# Patient Record
Sex: Male | Born: 1975 | Race: White | Hispanic: No | Marital: Married | Smoking: Current every day smoker
Health system: Southern US, Community
[De-identification: ages and names within clinical notes are randomized; demographics above are authoritative.]

---

## 1998-06-11 ENCOUNTER — Observation Stay (HOSPITAL_COMMUNITY): Admission: EM | Admit: 1998-06-11 | Discharge: 1998-06-12 | Payer: Self-pay | Admitting: Emergency Medicine

## 2011-06-07 NOTE — Progress Notes (Signed)
Encounter addended by: Mady Gemma, PHARMD on: 06/07/2011  3:21 PM<BR>     Documentation filed: Orders

## 2014-07-25 ENCOUNTER — Emergency Department (HOSPITAL_COMMUNITY): Payer: 59

## 2014-07-25 ENCOUNTER — Encounter (HOSPITAL_COMMUNITY): Payer: Self-pay

## 2014-07-25 ENCOUNTER — Emergency Department (HOSPITAL_COMMUNITY)
Admission: EM | Admit: 2014-07-25 | Discharge: 2014-07-25 | Disposition: A | Discharge status: Home / Self Care | Payer: 59 | Attending: Emergency Medicine | Admitting: Emergency Medicine

## 2014-07-25 DIAGNOSIS — Z720 Tobacco use: Secondary | ICD-10-CM | POA: Diagnosis not present

## 2014-07-25 DIAGNOSIS — R109 Unspecified abdominal pain: Secondary | ICD-10-CM | POA: Insufficient documentation

## 2014-07-25 LAB — COMPREHENSIVE METABOLIC PANEL
ALT: 39 U/L (ref 0–53)
AST: 24 U/L (ref 0–37)
Albumin: 5.1 g/dL (ref 3.5–5.2)
Alkaline Phosphatase: 72 U/L (ref 39–117)
Anion gap: 6 (ref 5–15)
BUN: 11 mg/dL (ref 6–23)
CO2: 24 mmol/L (ref 19–32)
Calcium: 9.5 mg/dL (ref 8.4–10.5)
Chloride: 107 mmol/L (ref 96–112)
Creatinine, Ser: 0.98 mg/dL (ref 0.50–1.35)
GFR calc Af Amer: >90 mL/min (ref >90)
GFR calc non Af Amer: >90 mL/min (ref >90)
Glucose, Bld: 102 mg/dL — ABNORMAL HIGH (ref 70–99)
Potassium: 3.6 mmol/L (ref 3.5–5.1)
Sodium: 137 mmol/L (ref 135–145)
Total Bilirubin: 0.9 mg/dL (ref 0.3–1.2)
Total Protein: 9 g/dL — ABNORMAL HIGH (ref 6.0–8.3)

## 2014-07-25 LAB — URINALYSIS, ROUTINE W REFLEX MICROSCOPIC
Bilirubin Urine: NEGATIVE
Glucose, UA: NEGATIVE mg/dL
Hgb urine dipstick: NEGATIVE
Ketones, ur: NEGATIVE mg/dL
Leukocytes, UA: NEGATIVE
Nitrite: NEGATIVE
Protein, ur: NEGATIVE mg/dL
Specific Gravity, Urine: 1.02 (ref 1.005–1.030)
Urobilinogen, UA: 0.2 mg/dL (ref 0.0–1.0)
pH: 6 (ref 5.0–8.0)

## 2014-07-25 LAB — CBC WITH DIFFERENTIAL/PLATELET
Basophils Absolute: 0 10*3/uL (ref 0.0–0.1)
Basophils Relative: 0 % (ref 0–1)
Eosinophils Absolute: 0 10*3/uL (ref 0.0–0.7)
Eosinophils Relative: 0 % (ref 0–5)
HCT: 48.1 % (ref 39.0–52.0)
Hemoglobin: 16.2 g/dL (ref 13.0–17.0)
Lymphocytes Relative: 19 % (ref 12–46)
Lymphs Abs: 2.4 10*3/uL (ref 0.7–4.0)
MCH: 28.7 pg (ref 26.0–34.0)
MCHC: 33.7 g/dL (ref 30.0–36.0)
MCV: 85.1 fL (ref 78.0–100.0)
Monocytes Absolute: 1.1 10*3/uL — ABNORMAL HIGH (ref 0.1–1.0)
Monocytes Relative: 9 % (ref 3–12)
Neutro Abs: 8.9 10*3/uL — ABNORMAL HIGH (ref 1.7–7.7)
Neutrophils Relative %: 72 % (ref 43–77)
Platelets: 411 10*3/uL — ABNORMAL HIGH (ref 150–400)
RBC: 5.65 MIL/uL (ref 4.22–5.81)
RDW: 13.7 % (ref 11.5–15.5)
WBC: 12.4 10*3/uL — ABNORMAL HIGH (ref 4.0–10.5)

## 2014-07-25 MED ORDER — OXYCODONE-ACETAMINOPHEN 5-325 MG PO TABS: 1.0000 | ORAL_TABLET | ORAL | Status: AC | PRN

## 2014-07-25 MED ORDER — IOHEXOL 300 MG/ML  SOLN
25.0000 mL | Freq: Once | INTRAMUSCULAR | Status: AC | PRN
  Administered 2014-07-25: 25 mL via ORAL

## 2014-07-25 MED ORDER — IOHEXOL 300 MG/ML  SOLN
100.0000 mL | Freq: Once | INTRAMUSCULAR | Status: AC | PRN
  Administered 2014-07-25: 100 mL via INTRAVENOUS

## 2014-07-25 MED ORDER — ONDANSETRON HCL 4 MG/2ML IJ SOLN
4.0000 mg | Freq: Once | INTRAMUSCULAR | Status: AC
  Administered 2014-07-25: 4 mg via INTRAMUSCULAR
  Filled 2014-07-25: qty 2

## 2014-07-25 MED ORDER — HYDROMORPHONE HCL 1 MG/ML IJ SOLN
1.0000 mg | Freq: Once | INTRAMUSCULAR | Status: AC
  Administered 2014-07-25: 1 mg via INTRAVENOUS
  Filled 2014-07-25: qty 1

## 2014-07-25 NOTE — ED Notes (Signed)
MD at bedside. 

## 2014-07-25 NOTE — Discharge Instructions (Signed)

## 2014-07-25 NOTE — ED Provider Notes (Signed)
CSN: [id number]     Arrival date & time 07/25/14  1231 History   First MD Initiated Contact with Patient 07/25/14 1305     Chief Complaint  Patient presents with  . Flank Pain     (Consider location/radiation/quality/duration/timing/severity/associated sxs/prior Treatment) HPI   39 year old male with abdominal pain. Right sided. Gradual onset about 3 or 4 days ago. Radiation to the right flank. Initially waxing and waning, but now constant over the past 24 hours. No appreciable exacerbating relieving factors. Nausea, no vomiting. No urinary complaints. Several loose stools. No fevers or chills. No sick contacts.  History reviewed. No pertinent past medical history. History reviewed. No pertinent past surgical history. No family history on file. History  Substance Use Topics  . Smoking status: Current Every Day Smoker  . Smokeless tobacco: Not on file  . Alcohol Use: No    Review of Systems  All systems reviewed and negative, other than as noted in HPI.   Allergies  Review of patient's allergies indicates no known allergies.  Home Medications   Prior to Admission medications   Not on File   BP 134/97 mmHg  Pulse 90  Temp(Src) 98.4 F (36.9 C) (Oral)  Resp 18  Ht  (1.702 m)  Wt 205 lb (92.987 kg)  BMI 32.10 kg/m2  SpO2 96% Physical Exam  Constitutional: He appears well-developed and well-nourished. No distress.  HENT:  Head: Normocephalic and atraumatic.  Eyes: Conjunctivae are normal. Right eye exhibits no discharge. Left eye exhibits no discharge.  Neck: Neck supple.  Cardiovascular: Normal rate, regular rhythm and normal heart sounds.  Exam reveals no gallop and no friction rub.   No murmur heard. Pulmonary/Chest: Effort normal and breath sounds normal. No respiratory distress.  Abdominal: Soft. He exhibits no distension. There is tenderness. There is no rebound and no guarding.  Musculoskeletal: He exhibits no edema or tenderness.  Neurological: He is  alert.  Skin: Skin is warm and dry.  Psychiatric: He has a normal mood and affect. His behavior is normal. Thought content normal.  Nursing note and vitals reviewed.   ED Course  Procedures (including critical care time) Labs Review Labs Reviewed  CBC WITH DIFFERENTIAL/PLATELET - Abnormal; Notable for the following:    WBC 12.4 (*)    Platelets 411 (*)    Neutro Abs 8.9 (*)    Monocytes Absolute 1.1 (*)    All other components within normal limits  COMPREHENSIVE METABOLIC PANEL - Abnormal; Notable for the following:    Glucose, Bld 102 (*)    Total Protein 9.0 (*)    All other components within normal limits  URINALYSIS, ROUTINE W REFLEX MICROSCOPIC    Imaging Review Dg Chest 2 View  07/25/2014   CLINICAL DATA:  Three day history of intermittent chest pain  EXAM: CHEST  2 VIEW  COMPARISON:  None.  FINDINGS: The heart size and mediastinal contours are within normal limits. Both lungs are clear. The visualized skeletal structures are unremarkable.  IMPRESSION: Normal chest x-ray.   Electronically Signed   By: Rudie Meyer M.D.   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 Numerous symptoms that do not necessarily correlate well. Says R lower chest/flank/RUQ and sometimes post-prandial. Consider biliary colic. Only tenderness I can illicit is in RLQ though and mild at that. Dysuria but UA normal. No CVA tenderness. I do not get the impression this is renal colic. I think less likely PUD/GERD.  No respiratory complaints. CXR clear. No increased WOB. Not pleuritic. Doubt PE. Doubt ACS. Will CT to with some RLQ tenderness although my overall suspicion for acute surgical process or other serious pathology is generally low. Symptomatic tx. Discussed with Dr Estell HarpinZammit, with CT pending. I feel pt is appropriate for discharge assuming no significant CT findings.    Raeford RazorStephen Nyeshia Mysliwiec, MD 07/29/14 1900

## 2014-07-25 NOTE — ED Notes (Signed)
Pt c/o pain under r breast x 3 or 4 days.  Reports r flank pain started around the same time.  Reports burning with urination, no obvious blood in urine.  Denies n/v but reports diarrhea.  Denies any SOB.  Denies history of kidney stones.

## 2015-11-29 ENCOUNTER — Other Ambulatory Visit (HOSPITAL_COMMUNITY): Payer: Self-pay | Admitting: Internal Medicine

## 2015-11-29 DIAGNOSIS — R319 Hematuria, unspecified: Secondary | ICD-10-CM

## 2015-12-09 ENCOUNTER — Ambulatory Visit (HOSPITAL_COMMUNITY)
Admission: RE | Admit: 2015-12-09 | Discharge: 2015-12-09 | Disposition: A | Discharge status: Home / Self Care | Payer: 59 | Source: Ambulatory Visit | Attending: Internal Medicine | Admitting: Internal Medicine

## 2015-12-09 DIAGNOSIS — R319 Hematuria, unspecified: Secondary | ICD-10-CM | POA: Diagnosis not present

## 2016-08-03 DIAGNOSIS — G894 Chronic pain syndrome: Secondary | ICD-10-CM | POA: Diagnosis not present

## 2016-08-31 DIAGNOSIS — G894 Chronic pain syndrome: Secondary | ICD-10-CM | POA: Diagnosis not present

## 2016-08-31 DIAGNOSIS — M47816 Spondylosis without myelopathy or radiculopathy, lumbar region: Secondary | ICD-10-CM | POA: Diagnosis not present

## 2016-11-23 DIAGNOSIS — G894 Chronic pain syndrome: Secondary | ICD-10-CM | POA: Diagnosis not present

## 2017-02-15 DIAGNOSIS — G894 Chronic pain syndrome: Secondary | ICD-10-CM | POA: Diagnosis not present

## 2017-05-10 DIAGNOSIS — G894 Chronic pain syndrome: Secondary | ICD-10-CM | POA: Diagnosis not present

## 2017-08-02 DIAGNOSIS — G894 Chronic pain syndrome: Secondary | ICD-10-CM | POA: Diagnosis not present

## 2017-08-02 DIAGNOSIS — J309 Allergic rhinitis, unspecified: Secondary | ICD-10-CM | POA: Diagnosis not present

## 2017-09-04 IMAGING — CT CT RENAL STONE PROTOCOL
2 of 4 series · 17 of 46 positions shown, 19 images · non-contrast
Comparison: None.

CLINICAL DATA: Right-sided pain for 3-4 months with hematuria

EXAM:
CT ABDOMEN AND PELVIS WITHOUT CONTRAST
TECHNIQUE: Multidetector CT imaging of the abdomen and pelvis was performed
following the standard protocol without IV contrast.

[Series 2: routine abd pel with · axial · 0.84mm/px · z∈[-446,-6]mm · 14 of 98 slices shown, 16 images]
[im 5/98  soft-tissue]
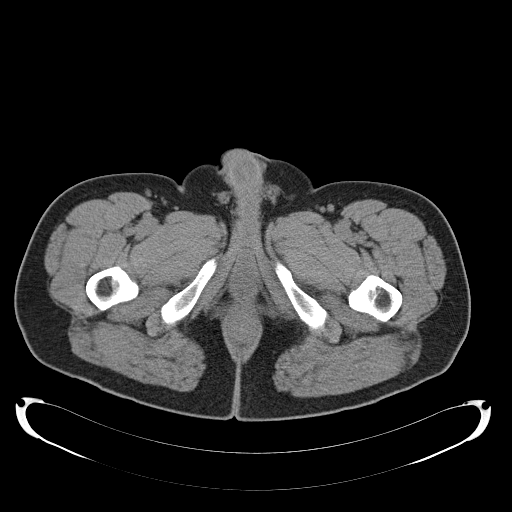
[im 5/98  bone]
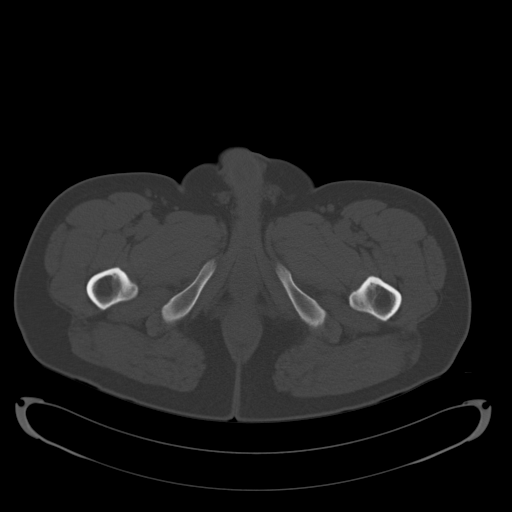
[im 13/98  soft-tissue]
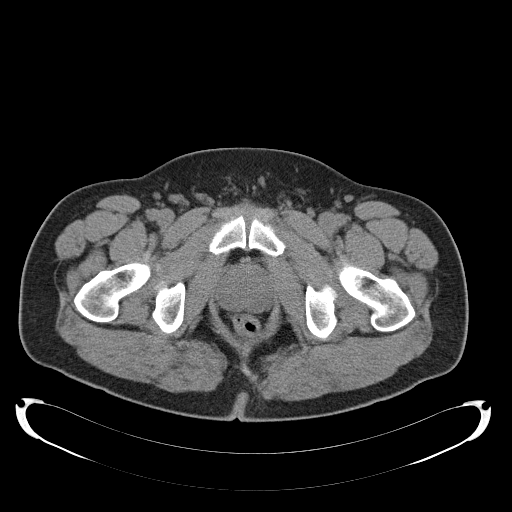
[im 17/98  soft-tissue]
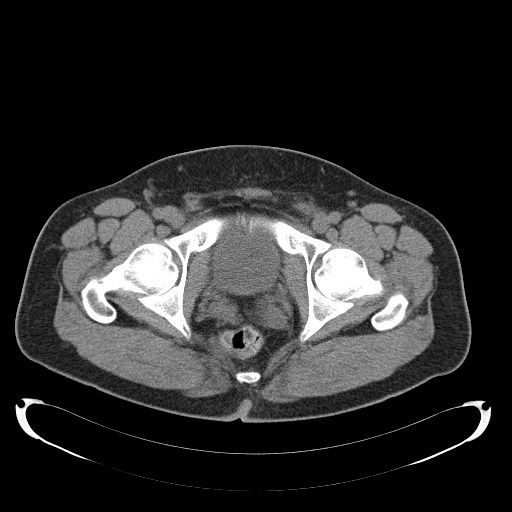
[im 26/98  soft-tissue]
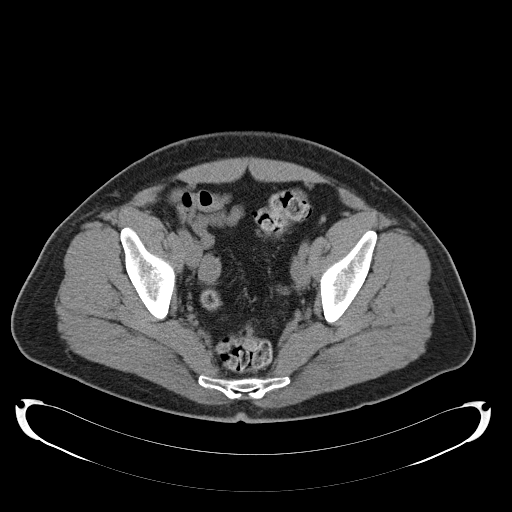
[im 34/98  soft-tissue]
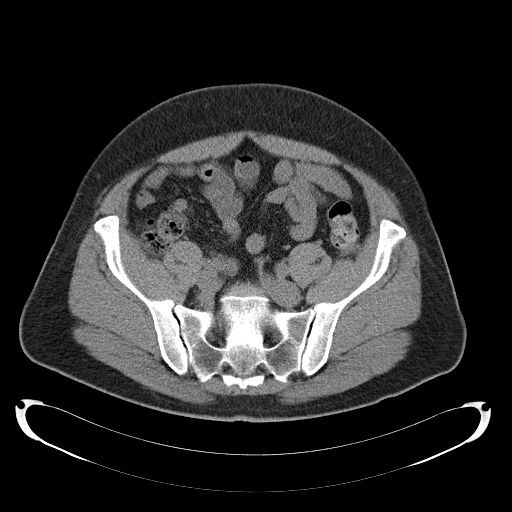
[im 38/98  soft-tissue]
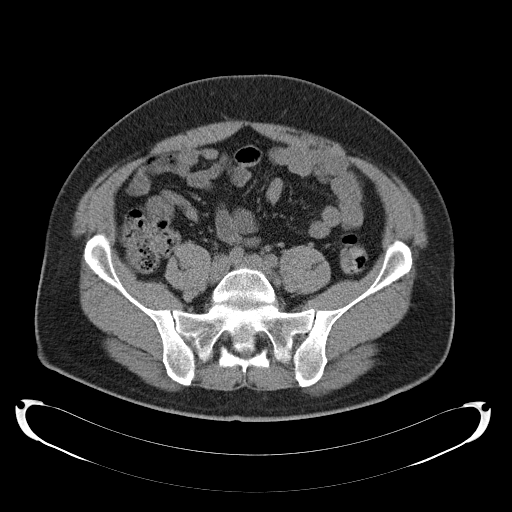
[im 47/98  soft-tissue]
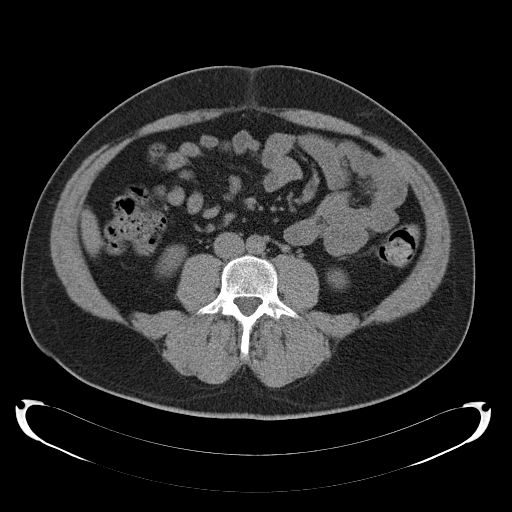
[im 51/98  soft-tissue]
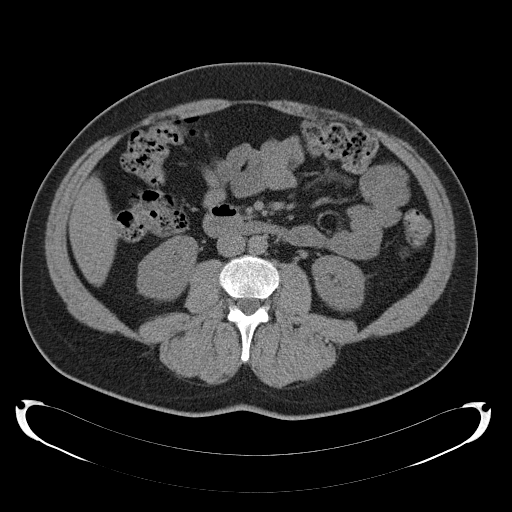
[im 60/98  soft-tissue]
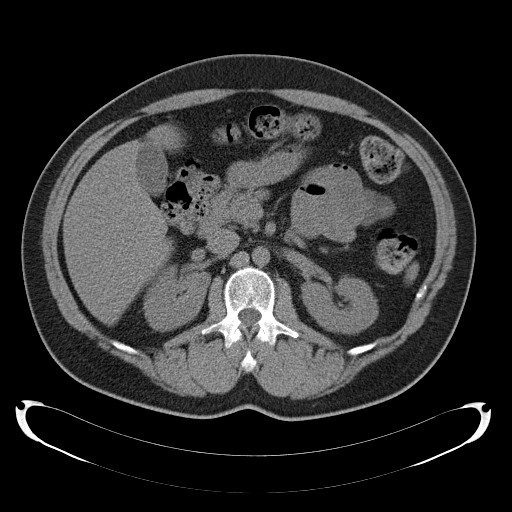
[im 60/98  bone]
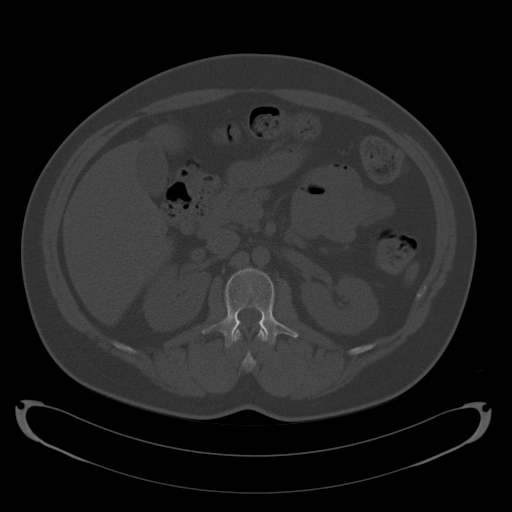
[im 64/98  soft-tissue]
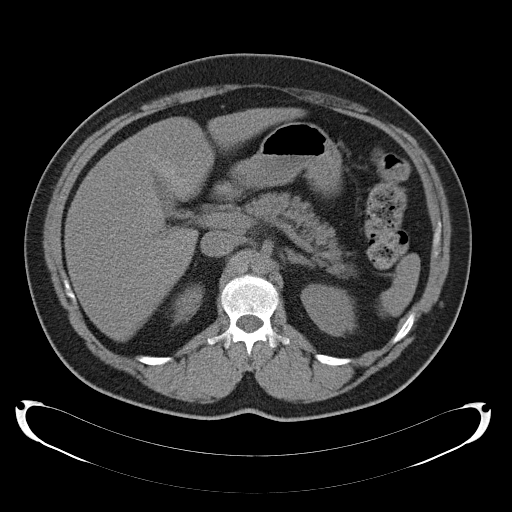
[im 72/98  soft-tissue]
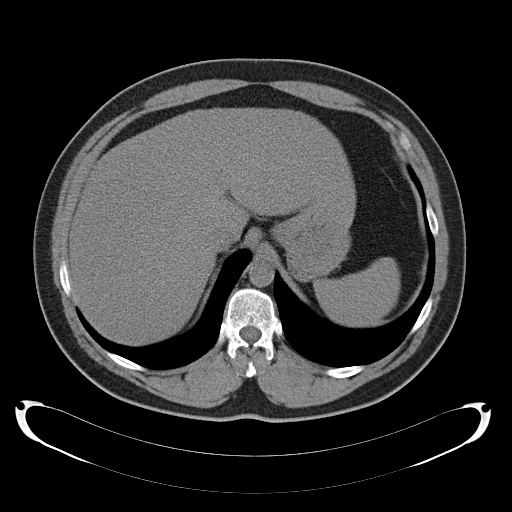
[im 81/98  soft-tissue]
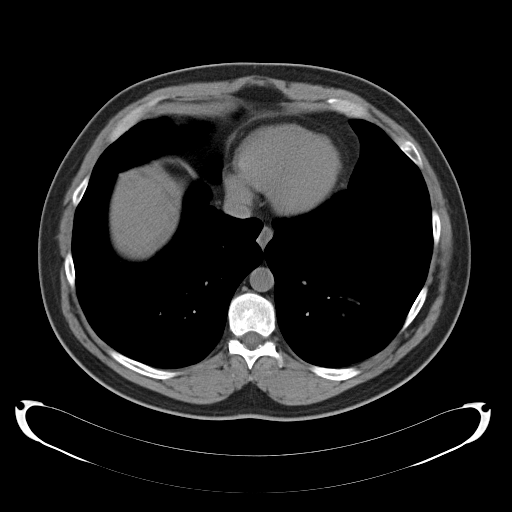
[im 85/98  soft-tissue]
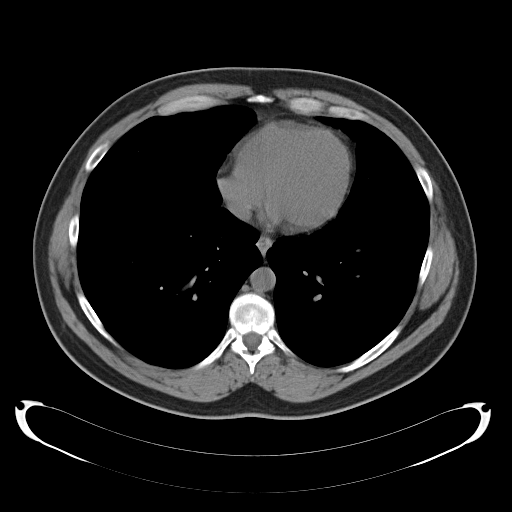
[im 93/98  soft-tissue]
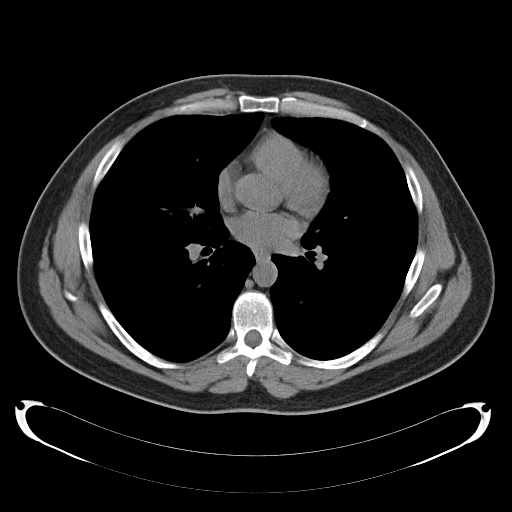

[Series 3: coronal · coronal · 0.87mm/px · 3 of 152 slices shown]
[im 51/152  soft-tissue]
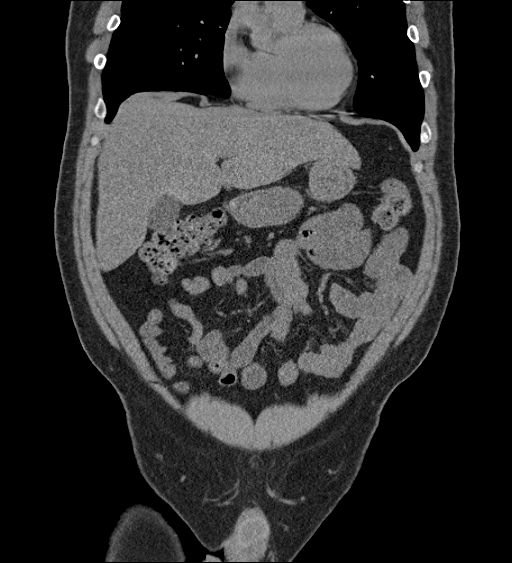
[im 68/152  soft-tissue]
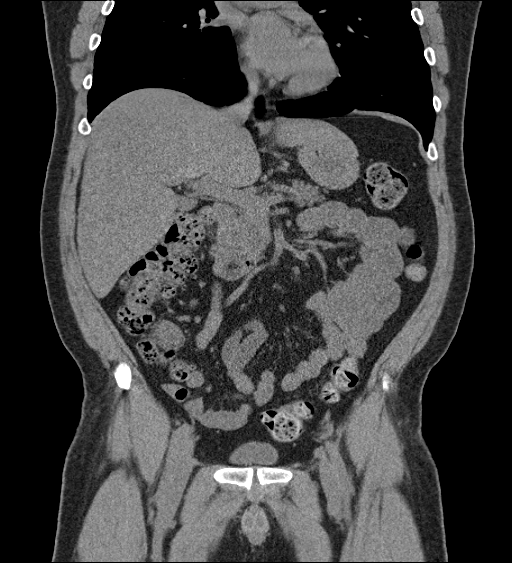
[im 84/152  soft-tissue]
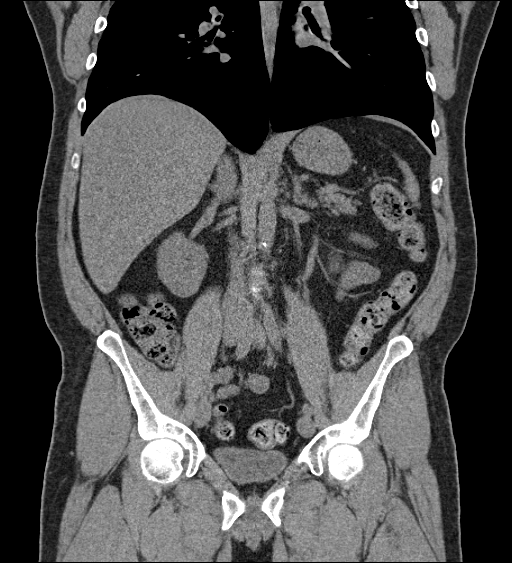

[17 of 46 positions shown; findings below may reference images not displayed]

FINDINGS: Lower chest:  No acute findings.

Hepatobiliary: No mass visualized on this un-enhanced exam.

Pancreas: No mass or inflammatory process identified on this
un-enhanced exam.

Spleen: Within normal limits in size.

Adrenals/Urinary Tract: Normal adrenal glands. No evidence of
urolithiasis or hydronephrosis. No definite mass visualized on this
un-enhanced exam. Normal bladder.

Stomach/Bowel: No evidence of obstruction, inflammatory process, or
abnormal fluid collections. No pneumatosis, pneumoperitoneum or
portal venous gas. Diverticulosis without evidence of
diverticulitis. Normal appendix.

Vascular/Lymphatic: No pathologically enlarged lymph nodes. No
evidence of abdominal aortic aneurysm.

Reproductive: No mass or other significant abnormality.

Other: None.

Musculoskeletal: No suspicious bone lesions identified. Bilateral L5
pars interarticularis defects with grade 1 anterolisthesis of L5 on
S1.
IMPRESSION: 1. No urolithiasis or obstructive uropathy.

## 2017-10-11 DIAGNOSIS — M1711 Unilateral primary osteoarthritis, right knee: Secondary | ICD-10-CM | POA: Diagnosis not present

## 2017-10-11 DIAGNOSIS — M25561 Pain in right knee: Secondary | ICD-10-CM | POA: Diagnosis not present

## 2017-10-11 DIAGNOSIS — M25469 Effusion, unspecified knee: Secondary | ICD-10-CM | POA: Diagnosis not present

## 2017-10-24 DIAGNOSIS — M25461 Effusion, right knee: Secondary | ICD-10-CM | POA: Diagnosis not present

## 2017-10-24 DIAGNOSIS — G894 Chronic pain syndrome: Secondary | ICD-10-CM | POA: Diagnosis not present

## 2017-10-24 DIAGNOSIS — M1991 Primary osteoarthritis, unspecified site: Secondary | ICD-10-CM | POA: Diagnosis not present

## 2017-11-15 DIAGNOSIS — M238X1 Other internal derangements of right knee: Secondary | ICD-10-CM | POA: Diagnosis not present

## 2017-11-15 DIAGNOSIS — M25561 Pain in right knee: Secondary | ICD-10-CM | POA: Diagnosis not present

## 2017-11-22 DIAGNOSIS — M25561 Pain in right knee: Secondary | ICD-10-CM | POA: Diagnosis not present

## 2017-12-06 DIAGNOSIS — M1712 Unilateral primary osteoarthritis, left knee: Secondary | ICD-10-CM | POA: Diagnosis not present

## 2017-12-06 DIAGNOSIS — M1711 Unilateral primary osteoarthritis, right knee: Secondary | ICD-10-CM | POA: Diagnosis not present

## 2018-01-17 DIAGNOSIS — M1991 Primary osteoarthritis, unspecified site: Secondary | ICD-10-CM | POA: Diagnosis not present

## 2018-01-17 DIAGNOSIS — G894 Chronic pain syndrome: Secondary | ICD-10-CM | POA: Diagnosis not present

## 2018-01-17 DIAGNOSIS — K047 Periapical abscess without sinus: Secondary | ICD-10-CM | POA: Diagnosis not present

## 2018-04-11 DIAGNOSIS — M2550 Pain in unspecified joint: Secondary | ICD-10-CM | POA: Diagnosis not present

## 2018-04-11 DIAGNOSIS — Z1389 Encounter for screening for other disorder: Secondary | ICD-10-CM | POA: Diagnosis not present

## 2018-04-11 DIAGNOSIS — M1991 Primary osteoarthritis, unspecified site: Secondary | ICD-10-CM | POA: Diagnosis not present

## 2018-04-11 DIAGNOSIS — Z0001 Encounter for general adult medical examination with abnormal findings: Secondary | ICD-10-CM | POA: Diagnosis not present

## 2018-04-11 DIAGNOSIS — G894 Chronic pain syndrome: Secondary | ICD-10-CM | POA: Diagnosis not present

## 2018-07-04 DIAGNOSIS — Z719 Counseling, unspecified: Secondary | ICD-10-CM | POA: Diagnosis not present

## 2018-07-04 DIAGNOSIS — M1991 Primary osteoarthritis, unspecified site: Secondary | ICD-10-CM | POA: Diagnosis not present

## 2018-07-04 DIAGNOSIS — G894 Chronic pain syndrome: Secondary | ICD-10-CM | POA: Diagnosis not present

## 2018-08-01 DIAGNOSIS — M1991 Primary osteoarthritis, unspecified site: Secondary | ICD-10-CM | POA: Diagnosis not present

## 2018-08-01 DIAGNOSIS — G894 Chronic pain syndrome: Secondary | ICD-10-CM | POA: Diagnosis not present

## 2018-08-01 DIAGNOSIS — I10 Essential (primary) hypertension: Secondary | ICD-10-CM | POA: Diagnosis not present

## 2018-08-19 DIAGNOSIS — E6609 Other obesity due to excess calories: Secondary | ICD-10-CM | POA: Diagnosis not present

## 2018-08-19 DIAGNOSIS — Z6835 Body mass index (BMI) 35.0-35.9, adult: Secondary | ICD-10-CM | POA: Diagnosis not present

## 2018-08-19 DIAGNOSIS — G894 Chronic pain syndrome: Secondary | ICD-10-CM | POA: Diagnosis not present

## 2018-10-22 DIAGNOSIS — G894 Chronic pain syndrome: Secondary | ICD-10-CM | POA: Diagnosis not present

## 2021-01-25 DIAGNOSIS — G894 Chronic pain syndrome: Secondary | ICD-10-CM | POA: Diagnosis not present

## 2021-01-25 DIAGNOSIS — M1991 Primary osteoarthritis, unspecified site: Secondary | ICD-10-CM | POA: Diagnosis not present

## 2021-01-25 DIAGNOSIS — R5383 Other fatigue: Secondary | ICD-10-CM | POA: Diagnosis not present

## 2021-02-23 DIAGNOSIS — I10 Essential (primary) hypertension: Secondary | ICD-10-CM | POA: Diagnosis not present

## 2021-02-23 DIAGNOSIS — M1991 Primary osteoarthritis, unspecified site: Secondary | ICD-10-CM | POA: Diagnosis not present

## 2021-02-23 DIAGNOSIS — G894 Chronic pain syndrome: Secondary | ICD-10-CM | POA: Diagnosis not present

## 2021-02-23 DIAGNOSIS — F419 Anxiety disorder, unspecified: Secondary | ICD-10-CM | POA: Diagnosis not present

## 2021-03-14 DIAGNOSIS — G4733 Obstructive sleep apnea (adult) (pediatric): Secondary | ICD-10-CM | POA: Diagnosis not present

## 2021-03-21 DIAGNOSIS — Z6835 Body mass index (BMI) 35.0-35.9, adult: Secondary | ICD-10-CM | POA: Diagnosis not present

## 2021-03-21 DIAGNOSIS — M1991 Primary osteoarthritis, unspecified site: Secondary | ICD-10-CM | POA: Diagnosis not present

## 2021-03-21 DIAGNOSIS — E6609 Other obesity due to excess calories: Secondary | ICD-10-CM | POA: Diagnosis not present

## 2021-03-21 DIAGNOSIS — I10 Essential (primary) hypertension: Secondary | ICD-10-CM | POA: Diagnosis not present

## 2021-03-21 DIAGNOSIS — G894 Chronic pain syndrome: Secondary | ICD-10-CM | POA: Diagnosis not present

## 2021-04-13 DIAGNOSIS — G894 Chronic pain syndrome: Secondary | ICD-10-CM | POA: Diagnosis not present

## 2021-04-13 DIAGNOSIS — F419 Anxiety disorder, unspecified: Secondary | ICD-10-CM | POA: Diagnosis not present

## 2021-04-13 DIAGNOSIS — M1991 Primary osteoarthritis, unspecified site: Secondary | ICD-10-CM | POA: Diagnosis not present

## 2021-04-13 DIAGNOSIS — I10 Essential (primary) hypertension: Secondary | ICD-10-CM | POA: Diagnosis not present

## 2021-05-05 DIAGNOSIS — G894 Chronic pain syndrome: Secondary | ICD-10-CM | POA: Diagnosis not present

## 2021-05-05 DIAGNOSIS — E6609 Other obesity due to excess calories: Secondary | ICD-10-CM | POA: Diagnosis not present

## 2021-05-05 DIAGNOSIS — Z6836 Body mass index (BMI) 36.0-36.9, adult: Secondary | ICD-10-CM | POA: Diagnosis not present

## 2021-05-05 DIAGNOSIS — I10 Essential (primary) hypertension: Secondary | ICD-10-CM | POA: Diagnosis not present

## 2021-05-05 DIAGNOSIS — M1991 Primary osteoarthritis, unspecified site: Secondary | ICD-10-CM | POA: Diagnosis not present

## 2021-07-27 DIAGNOSIS — G4733 Obstructive sleep apnea (adult) (pediatric): Secondary | ICD-10-CM | POA: Diagnosis not present

## 2021-08-08 DIAGNOSIS — I10 Essential (primary) hypertension: Secondary | ICD-10-CM | POA: Diagnosis not present

## 2021-08-08 DIAGNOSIS — G894 Chronic pain syndrome: Secondary | ICD-10-CM | POA: Diagnosis not present

## 2021-08-08 DIAGNOSIS — M1991 Primary osteoarthritis, unspecified site: Secondary | ICD-10-CM | POA: Diagnosis not present

## 2021-08-24 DIAGNOSIS — G4733 Obstructive sleep apnea (adult) (pediatric): Secondary | ICD-10-CM | POA: Diagnosis not present

## 2021-08-31 DIAGNOSIS — I10 Essential (primary) hypertension: Secondary | ICD-10-CM | POA: Diagnosis not present

## 2021-08-31 DIAGNOSIS — G894 Chronic pain syndrome: Secondary | ICD-10-CM | POA: Diagnosis not present

## 2021-08-31 DIAGNOSIS — M1991 Primary osteoarthritis, unspecified site: Secondary | ICD-10-CM | POA: Diagnosis not present

## 2021-09-14 DIAGNOSIS — F1120 Opioid dependence, uncomplicated: Secondary | ICD-10-CM | POA: Diagnosis not present

## 2021-09-14 DIAGNOSIS — M545 Low back pain, unspecified: Secondary | ICD-10-CM | POA: Diagnosis not present

## 2021-09-14 DIAGNOSIS — F1129 Opioid dependence with unspecified opioid-induced disorder: Secondary | ICD-10-CM | POA: Diagnosis not present

## 2021-09-14 DIAGNOSIS — M130 Polyarthritis, unspecified: Secondary | ICD-10-CM | POA: Diagnosis not present

## 2021-09-14 DIAGNOSIS — G894 Chronic pain syndrome: Secondary | ICD-10-CM | POA: Diagnosis not present

## 2021-09-24 DIAGNOSIS — G4733 Obstructive sleep apnea (adult) (pediatric): Secondary | ICD-10-CM | POA: Diagnosis not present

## 2021-09-26 DIAGNOSIS — G894 Chronic pain syndrome: Secondary | ICD-10-CM | POA: Diagnosis not present

## 2021-09-26 DIAGNOSIS — F1120 Opioid dependence, uncomplicated: Secondary | ICD-10-CM | POA: Diagnosis not present

## 2021-09-26 DIAGNOSIS — M130 Polyarthritis, unspecified: Secondary | ICD-10-CM | POA: Diagnosis not present

## 2021-09-26 DIAGNOSIS — M545 Low back pain, unspecified: Secondary | ICD-10-CM | POA: Diagnosis not present

## 2021-09-26 DIAGNOSIS — F1129 Opioid dependence with unspecified opioid-induced disorder: Secondary | ICD-10-CM | POA: Diagnosis not present

## 2021-10-17 DIAGNOSIS — F1120 Opioid dependence, uncomplicated: Secondary | ICD-10-CM | POA: Diagnosis not present

## 2021-10-17 DIAGNOSIS — G894 Chronic pain syndrome: Secondary | ICD-10-CM | POA: Diagnosis not present

## 2021-10-17 DIAGNOSIS — F1129 Opioid dependence with unspecified opioid-induced disorder: Secondary | ICD-10-CM | POA: Diagnosis not present

## 2021-10-17 DIAGNOSIS — M545 Low back pain, unspecified: Secondary | ICD-10-CM | POA: Diagnosis not present

## 2021-10-17 DIAGNOSIS — M130 Polyarthritis, unspecified: Secondary | ICD-10-CM | POA: Diagnosis not present

## 2021-10-24 DIAGNOSIS — G4733 Obstructive sleep apnea (adult) (pediatric): Secondary | ICD-10-CM | POA: Diagnosis not present

## 2021-11-14 DIAGNOSIS — M130 Polyarthritis, unspecified: Secondary | ICD-10-CM | POA: Diagnosis not present

## 2021-11-14 DIAGNOSIS — M545 Low back pain, unspecified: Secondary | ICD-10-CM | POA: Diagnosis not present

## 2021-11-14 DIAGNOSIS — G894 Chronic pain syndrome: Secondary | ICD-10-CM | POA: Diagnosis not present

## 2021-11-14 DIAGNOSIS — F1129 Opioid dependence with unspecified opioid-induced disorder: Secondary | ICD-10-CM | POA: Diagnosis not present

## 2021-11-14 DIAGNOSIS — F1120 Opioid dependence, uncomplicated: Secondary | ICD-10-CM | POA: Diagnosis not present

## 2021-11-24 DIAGNOSIS — G4733 Obstructive sleep apnea (adult) (pediatric): Secondary | ICD-10-CM | POA: Diagnosis not present

## 2021-12-11 DIAGNOSIS — I10 Essential (primary) hypertension: Secondary | ICD-10-CM | POA: Diagnosis not present

## 2021-12-11 DIAGNOSIS — E6609 Other obesity due to excess calories: Secondary | ICD-10-CM | POA: Diagnosis not present

## 2021-12-11 DIAGNOSIS — F419 Anxiety disorder, unspecified: Secondary | ICD-10-CM | POA: Diagnosis not present

## 2021-12-11 DIAGNOSIS — Z6837 Body mass index (BMI) 37.0-37.9, adult: Secondary | ICD-10-CM | POA: Diagnosis not present

## 2021-12-12 DIAGNOSIS — F1120 Opioid dependence, uncomplicated: Secondary | ICD-10-CM | POA: Diagnosis not present

## 2021-12-12 DIAGNOSIS — M545 Low back pain, unspecified: Secondary | ICD-10-CM | POA: Diagnosis not present

## 2021-12-12 DIAGNOSIS — F1129 Opioid dependence with unspecified opioid-induced disorder: Secondary | ICD-10-CM | POA: Diagnosis not present

## 2021-12-12 DIAGNOSIS — M130 Polyarthritis, unspecified: Secondary | ICD-10-CM | POA: Diagnosis not present

## 2021-12-12 DIAGNOSIS — G894 Chronic pain syndrome: Secondary | ICD-10-CM | POA: Diagnosis not present

## 2021-12-24 DIAGNOSIS — G4733 Obstructive sleep apnea (adult) (pediatric): Secondary | ICD-10-CM | POA: Diagnosis not present

## 2022-01-17 DIAGNOSIS — M545 Low back pain, unspecified: Secondary | ICD-10-CM | POA: Diagnosis not present

## 2022-01-17 DIAGNOSIS — F1120 Opioid dependence, uncomplicated: Secondary | ICD-10-CM | POA: Diagnosis not present

## 2022-01-17 DIAGNOSIS — M130 Polyarthritis, unspecified: Secondary | ICD-10-CM | POA: Diagnosis not present

## 2022-01-17 DIAGNOSIS — G894 Chronic pain syndrome: Secondary | ICD-10-CM | POA: Diagnosis not present

## 2022-01-17 DIAGNOSIS — F1129 Opioid dependence with unspecified opioid-induced disorder: Secondary | ICD-10-CM | POA: Diagnosis not present

## 2022-01-24 DIAGNOSIS — G4733 Obstructive sleep apnea (adult) (pediatric): Secondary | ICD-10-CM | POA: Diagnosis not present

## 2022-02-14 DIAGNOSIS — G894 Chronic pain syndrome: Secondary | ICD-10-CM | POA: Diagnosis not present

## 2022-02-14 DIAGNOSIS — F1129 Opioid dependence with unspecified opioid-induced disorder: Secondary | ICD-10-CM | POA: Diagnosis not present

## 2022-02-14 DIAGNOSIS — M130 Polyarthritis, unspecified: Secondary | ICD-10-CM | POA: Diagnosis not present

## 2022-02-14 DIAGNOSIS — M545 Low back pain, unspecified: Secondary | ICD-10-CM | POA: Diagnosis not present

## 2022-02-24 DIAGNOSIS — G4733 Obstructive sleep apnea (adult) (pediatric): Secondary | ICD-10-CM | POA: Diagnosis not present

## 2022-03-06 DIAGNOSIS — I10 Essential (primary) hypertension: Secondary | ICD-10-CM | POA: Diagnosis not present

## 2022-03-06 DIAGNOSIS — M1991 Primary osteoarthritis, unspecified site: Secondary | ICD-10-CM | POA: Diagnosis not present

## 2022-03-06 DIAGNOSIS — F419 Anxiety disorder, unspecified: Secondary | ICD-10-CM | POA: Diagnosis not present

## 2022-03-06 DIAGNOSIS — G894 Chronic pain syndrome: Secondary | ICD-10-CM | POA: Diagnosis not present

## 2022-03-26 DIAGNOSIS — G4733 Obstructive sleep apnea (adult) (pediatric): Secondary | ICD-10-CM | POA: Diagnosis not present

## 2022-04-09 DIAGNOSIS — F1129 Opioid dependence with unspecified opioid-induced disorder: Secondary | ICD-10-CM | POA: Diagnosis not present

## 2022-04-09 DIAGNOSIS — M545 Low back pain, unspecified: Secondary | ICD-10-CM | POA: Diagnosis not present

## 2022-04-09 DIAGNOSIS — M130 Polyarthritis, unspecified: Secondary | ICD-10-CM | POA: Diagnosis not present

## 2022-04-09 DIAGNOSIS — G894 Chronic pain syndrome: Secondary | ICD-10-CM | POA: Diagnosis not present

## 2022-05-23 DIAGNOSIS — M1991 Primary osteoarthritis, unspecified site: Secondary | ICD-10-CM | POA: Diagnosis not present

## 2022-05-23 DIAGNOSIS — I10 Essential (primary) hypertension: Secondary | ICD-10-CM | POA: Diagnosis not present

## 2022-05-23 DIAGNOSIS — F419 Anxiety disorder, unspecified: Secondary | ICD-10-CM | POA: Diagnosis not present

## 2022-05-23 DIAGNOSIS — G894 Chronic pain syndrome: Secondary | ICD-10-CM | POA: Diagnosis not present

## 2022-06-26 DIAGNOSIS — F1120 Opioid dependence, uncomplicated: Secondary | ICD-10-CM | POA: Diagnosis not present

## 2022-06-26 DIAGNOSIS — F419 Anxiety disorder, unspecified: Secondary | ICD-10-CM | POA: Diagnosis not present

## 2022-07-02 DIAGNOSIS — F1120 Opioid dependence, uncomplicated: Secondary | ICD-10-CM | POA: Diagnosis not present

## 2022-07-02 DIAGNOSIS — F419 Anxiety disorder, unspecified: Secondary | ICD-10-CM | POA: Diagnosis not present

## 2022-07-16 DIAGNOSIS — F1120 Opioid dependence, uncomplicated: Secondary | ICD-10-CM | POA: Diagnosis not present

## 2022-08-03 DIAGNOSIS — F1120 Opioid dependence, uncomplicated: Secondary | ICD-10-CM | POA: Diagnosis not present

## 2022-08-14 DIAGNOSIS — F1120 Opioid dependence, uncomplicated: Secondary | ICD-10-CM | POA: Diagnosis not present

## 2022-08-30 DIAGNOSIS — F1120 Opioid dependence, uncomplicated: Secondary | ICD-10-CM | POA: Diagnosis not present

## 2022-08-31 DIAGNOSIS — F1120 Opioid dependence, uncomplicated: Secondary | ICD-10-CM | POA: Diagnosis not present

## 2022-09-13 DIAGNOSIS — F1120 Opioid dependence, uncomplicated: Secondary | ICD-10-CM | POA: Diagnosis not present

## 2022-09-27 DIAGNOSIS — F1120 Opioid dependence, uncomplicated: Secondary | ICD-10-CM | POA: Diagnosis not present

## 2022-10-01 DIAGNOSIS — M1991 Primary osteoarthritis, unspecified site: Secondary | ICD-10-CM | POA: Diagnosis not present

## 2022-10-01 DIAGNOSIS — F419 Anxiety disorder, unspecified: Secondary | ICD-10-CM | POA: Diagnosis not present

## 2022-10-01 DIAGNOSIS — G894 Chronic pain syndrome: Secondary | ICD-10-CM | POA: Diagnosis not present

## 2022-10-01 DIAGNOSIS — I10 Essential (primary) hypertension: Secondary | ICD-10-CM | POA: Diagnosis not present
